# Patient Record
Sex: Female | Born: 1980 | Race: White | Hispanic: No | Marital: Married | Smoking: Current every day smoker
Health system: Southern US, Community
[De-identification: ages and names within clinical notes are randomized; demographics above are authoritative.]

---

## 2014-08-02 ENCOUNTER — Encounter (HOSPITAL_BASED_OUTPATIENT_CLINIC_OR_DEPARTMENT_OTHER): Payer: Self-pay

## 2014-08-02 ENCOUNTER — Emergency Department (HOSPITAL_BASED_OUTPATIENT_CLINIC_OR_DEPARTMENT_OTHER): Payer: BLUE CROSS/BLUE SHIELD

## 2014-08-02 ENCOUNTER — Observation Stay (HOSPITAL_BASED_OUTPATIENT_CLINIC_OR_DEPARTMENT_OTHER)
Admission: EM | Admit: 2014-08-02 | Discharge: 2014-08-03 | Disposition: A | Discharge status: Home / Self Care | Payer: BLUE CROSS/BLUE SHIELD | Attending: Internal Medicine | Admitting: Internal Medicine

## 2014-08-02 DIAGNOSIS — F17200 Nicotine dependence, unspecified, uncomplicated: Secondary | ICD-10-CM

## 2014-08-02 DIAGNOSIS — R079 Chest pain, unspecified: Secondary | ICD-10-CM | POA: Diagnosis present

## 2014-08-02 DIAGNOSIS — E785 Hyperlipidemia, unspecified: Secondary | ICD-10-CM | POA: Diagnosis not present

## 2014-08-02 DIAGNOSIS — Z720 Tobacco use: Secondary | ICD-10-CM | POA: Diagnosis not present

## 2014-08-02 DIAGNOSIS — I209 Angina pectoris, unspecified: Principal | ICD-10-CM | POA: Diagnosis present

## 2014-08-02 LAB — BASIC METABOLIC PANEL
ANION GAP: 3 — AB (ref 5–15)
BUN: 9 mg/dL (ref 6–23)
CO2: 26 mmol/L (ref 19–32)
Calcium: 7.8 mg/dL — ABNORMAL LOW (ref 8.4–10.5)
Chloride: 105 mmol/L (ref 96–112)
Creatinine, Ser: 0.86 mg/dL (ref 0.50–1.10)
GFR calc Af Amer: >90 mL/min (ref >90)
GFR calc non Af Amer: 87 mL/min — ABNORMAL LOW (ref >90)
Glucose, Bld: 93 mg/dL (ref 70–99)
Potassium: 4.4 mmol/L (ref 3.5–5.1)
Sodium: 134 mmol/L — ABNORMAL LOW (ref 135–145)

## 2014-08-02 LAB — CBC WITH DIFFERENTIAL/PLATELET
Basophils Absolute: 0 10*3/uL (ref 0.0–0.1)
Basophils Relative: 0 % (ref 0–1)
Eosinophils Absolute: 0.3 10*3/uL (ref 0.0–0.7)
Eosinophils Relative: 2 % (ref 0–5)
HCT: 39.3 % (ref 36.0–46.0)
Hemoglobin: 12.9 g/dL (ref 12.0–15.0)
Lymphocytes Relative: 22 % (ref 12–46)
Lymphs Abs: 2.5 10*3/uL (ref 0.7–4.0)
MCH: 30.1 pg (ref 26.0–34.0)
MCHC: 32.8 g/dL (ref 30.0–36.0)
MCV: 91.6 fL (ref 78.0–100.0)
MONOS PCT: 5 % (ref 3–12)
Monocytes Absolute: 0.6 10*3/uL (ref 0.1–1.0)
Neutro Abs: 8.2 10*3/uL — ABNORMAL HIGH (ref 1.7–7.7)
Neutrophils Relative %: 71 % (ref 43–77)
PLATELETS: 307 10*3/uL (ref 150–400)
RBC: 4.29 MIL/uL (ref 3.87–5.11)
RDW: 13.3 % (ref 11.5–15.5)
WBC: 11.5 10*3/uL — AB (ref 4.0–10.5)

## 2014-08-02 LAB — CBC
HEMATOCRIT: 35.6 % — AB (ref 36.0–46.0)
Hemoglobin: 12 g/dL (ref 12.0–15.0)
MCH: 30.4 pg (ref 26.0–34.0)
MCHC: 33.7 g/dL (ref 30.0–36.0)
MCV: 90.1 fL (ref 78.0–100.0)
PLATELETS: 305 10*3/uL (ref 150–400)
RBC: 3.95 MIL/uL (ref 3.87–5.11)
RDW: 13.4 % (ref 11.5–15.5)
WBC: 9.8 10*3/uL (ref 4.0–10.5)

## 2014-08-02 LAB — CREATININE, SERUM
Creatinine, Ser: 0.92 mg/dL (ref 0.50–1.10)
GFR, EST NON AFRICAN AMERICAN: 80 mL/min — AB (ref >90)

## 2014-08-02 LAB — PREGNANCY, URINE: Preg Test, Ur: NEGATIVE

## 2014-08-02 LAB — TROPONIN I
Troponin I: <0.03 ng/mL (ref <0.031)
Troponin I: <0.03 ng/mL (ref <0.031)

## 2014-08-02 MED ORDER — ASPIRIN 81 MG PO CHEW: 324.0000 mg | CHEWABLE_TABLET | ORAL | Status: DC

## 2014-08-02 MED ORDER — ASPIRIN 300 MG RE SUPP: 300.0000 mg | RECTAL | Status: DC

## 2014-08-02 MED ORDER — ASPIRIN 81 MG PO CHEW
CHEWABLE_TABLET | ORAL | Status: AC
Start: 2014-08-02 — End: 2014-08-02
  Administered 2014-08-02: 324 mg via ORAL
  Filled 2014-08-02: qty 4

## 2014-08-02 MED ORDER — ASPIRIN EC 81 MG PO TBEC
81.0000 mg | DELAYED_RELEASE_TABLET | Freq: Every day | ORAL | Status: DC
  Administered 2014-08-03: 81 mg via ORAL
  Filled 2014-08-02: qty 1

## 2014-08-02 MED ORDER — ONDANSETRON HCL 4 MG/2ML IJ SOLN
4.0000 mg | Freq: Four times a day (QID) | INTRAMUSCULAR | Status: DC | PRN
Start: 2014-08-02 — End: 2014-08-03

## 2014-08-02 MED ORDER — ACETAMINOPHEN 325 MG PO TABS: 650.0000 mg | ORAL_TABLET | ORAL | Status: DC | PRN

## 2014-08-02 MED ORDER — ACETAMINOPHEN 325 MG PO TABS
650.0000 mg | ORAL_TABLET | ORAL | Status: DC | PRN
  Administered 2014-08-02: 650 mg via ORAL
  Filled 2014-08-02: qty 2

## 2014-08-02 MED ORDER — ASPIRIN 81 MG PO CHEW
324.0000 mg | CHEWABLE_TABLET | Freq: Once | ORAL | Status: AC
  Administered 2014-08-02: 324 mg via ORAL

## 2014-08-02 MED ORDER — ACETAMINOPHEN 325 MG PO TABS
650.0000 mg | ORAL_TABLET | Freq: Once | ORAL | Status: AC
  Administered 2014-08-02: 650 mg via ORAL

## 2014-08-02 MED ORDER — NITROGLYCERIN 0.4 MG SL SUBL
0.4000 mg | SUBLINGUAL_TABLET | SUBLINGUAL | Status: DC | PRN
  Administered 2014-08-02 (×2): 0.4 mg via SUBLINGUAL
  Filled 2014-08-02: qty 1

## 2014-08-02 MED ORDER — ASPIRIN 325 MG PO TABS: 325.0000 mg | ORAL_TABLET | Freq: Once | ORAL | Status: DC

## 2014-08-02 MED ORDER — NITROGLYCERIN 0.4 MG SL SUBL: 0.4000 mg | SUBLINGUAL_TABLET | SUBLINGUAL | Status: DC | PRN

## 2014-08-02 MED ORDER — ACETAMINOPHEN 325 MG PO TABS
ORAL_TABLET | ORAL | Status: AC
  Administered 2014-08-02: 650 mg via ORAL
  Filled 2014-08-02: qty 2

## 2014-08-02 MED ORDER — KETOROLAC TROMETHAMINE 60 MG/2ML IM SOLN
30.0000 mg | Freq: Once | INTRAMUSCULAR | Status: AC
  Administered 2014-08-02: 30 mg via INTRAMUSCULAR
  Filled 2014-08-02: qty 2

## 2014-08-02 MED ORDER — ENOXAPARIN SODIUM 40 MG/0.4ML ~~LOC~~ SOLN
40.0000 mg | SUBCUTANEOUS | Status: DC
  Administered 2014-08-02: 40 mg via SUBCUTANEOUS
  Filled 2014-08-02: qty 0.4

## 2014-08-02 NOTE — H&P (Addendum)
[facility] History and Physical  Amy AmendJessica Lurz ZOX:[id number] DOB: [birth year] DOA: 08/02/2014   PCP: Pcp Not In System    Chief Complaint: chest pain  HPI: Amy Harris is a 34 y.o. female who is morbidly obese presents with chest pain on exertion starting yesterday afternoon. It is present in the center and on the left side of her chest and feels heavy. Associated with dizziness yesterday and shortness of breath today. Improved with rest and improved in the ER with nitro. Currently only complaint is headache. She states she has previously had normal cholesterol levels and is not a diabetic- father has multiple coronary stents- first MI at age 34.    General: The patient denies anorexia, fever, + weight loss 10 with dieting Cardiac: Denies syncope, palpitations, pedal edema  Respiratory: Denies hortness of breath, wheezing- has a chronic dry cough GI: Denies severe indigestion/heartburn, abdominal pain, nausea, vomiting, diarrhea and constipation GU: Denies hematuria, incontinence, dysuria  Musculoskeletal: Denies arthritis  Skin: Denies suspicious skin lesions Neurologic: Denies focal weakness or numbness, change in vision Psychiatry: Denies depression or anxiety. Hematologic: + easy bruising - no easy bleeding   History reviewed. No pertinent past medical history.  History reviewed. No pertinent past surgical history.  Social History: smoker- 1/2ppd for 15 yrs, does drink ETOH Lives at home with husband and children-     No Known Allergies  No family history on file. father has CAD- first MI at age 34- Both parents have DM   Prior to Admission medications   Not on File     Physical Exam: Filed Vitals:   08/02/14 1400 08/02/14 1548 08/02/14 1649 08/02/14 1723  BP: 114/69 123/74 131/76   Pulse: 66 66 60   Temp:   97.9 F (36.6 C)   TempSrc:   Oral   Resp: 13 20 18    Height:    5\' 6"  (1.676 m)  Weight:    127.733 kg (281 lb 9.6 oz)  SpO2: 99% 98% 97%       General: AAO x3 HEENT: Normocephalic and Atraumatic, Mucous membranes pink                PERRLA; EOM intact; No scleral icterus,                 Nares: Patent, Oropharynx: Clear, Fair Dentition                 Neck: FROM, no cervical lymphadenopathy, thyromegaly, carotid bruit or JVD;  Breasts: deferred CHEST WALL: No tenderness  CHEST: Normal respiration, clear to auscultation bilaterally  HEART: Regular rate and rhythm; no murmurs rubs or gallops  BACK: No kyphosis or scoliosis; no CVA tenderness  GI: Positive Bowel Sounds, soft, non-tender; no masses, no organomegaly Rectal Exam: deferred MSK: No cyanosis, clubbing, or edema Genitalia: not examined  SKIN:  no rash or ulceration  CNS: Alert and Oriented x 4, Nonfocal exam, CN 2-12 intact  Labs on Admission:  Basic Metabolic Panel:  Recent Labs Lab 08/02/14 1300  NA 134*  K 4.4  CL 105  CO2 26  GLUCOSE 93  BUN 9  CREATININE 0.86  CALCIUM 7.8*   Liver Function Tests: No results for input(s): AST, ALT, ALKPHOS, BILITOT, PROT, ALBUMIN in the last 168 hours. No results for input(s): LIPASE, AMYLASE in the last 168 hours. No results for input(s): AMMONIA in the last 168 hours. CBC:  Recent Labs Lab 08/02/14 1300  WBC 11.5*  NEUTROABS 8.2*  HGB 12.9  HCT 39.3  MCV 91.6  PLT 307   Cardiac Enzymes:  Recent Labs Lab 08/02/14 1300  TROPONINI <0.03    BNP (last 3 results) No results for input(s): BNP in the last 8760 hours.  ProBNP (last 3 results) No results for input(s): PROBNP in the last 8760 hours.  CBG: No results for input(s): GLUCAP in the last 168 hours.  Radiological Exams on Admission: Dg Chest 2 View  08/02/2014   CLINICAL DATA:  Chest pain  EXAM: CHEST  2 VIEW  COMPARISON:  None.  FINDINGS: The heart size and mediastinal contours are within normal limits. Both lungs are clear. The visualized skeletal structures are unremarkable.  IMPRESSION: No active cardiopulmonary disease.    Electronically Signed   By: Jolaine Click M.D.   On: 08/02/2014 13:17    EKG: Independently reviewed. NSR- no ST- T wave changes  Assessment/Plan Active Problems: Angina pectoris - ekg as above - cardiology recommends a myoview stress test tomorrow- ER doc spoke with Dr Johney Frame already- I will make her NPO after midnight - check Troponin x 3, cont Baby ASA which she started yesterday - check lipid panel and A1c  Morbid obesity Body mass index is 45.47 kg/(m^2). - attempting to lose weight    Consulted: cardiology  Code Status: full code Family Communication:   DVT Prophylaxis:lovenox  Time spent: 45 min  Kesi Perrow, MD [facility]  If 7PM-7AM, please contact night-coverage [email or web address] 08/02/2014, 5:24 PM

## 2014-08-02 NOTE — ED Provider Notes (Signed)
CSN: [id number]     Arrival date & time 08/02/14  1135 History   First MD Initiated Contact with Patient 08/02/14 1207     Chief Complaint  Patient presents with  . Chest Pain     (Consider location/radiation/quality/duration/timing/severity/associated sxs/prior Treatment) HPI   Toni AmendJessica Korber is a 34 y.o. female complaining of chest pain. The pain began yesterday at 3 pm while she was watching television and has been constant since then. She describes the pain as tightness over the anterior chest wall that radiates to both shoulder blades. She states that on exertion the pain becomes worse, becoming a squeezing pain that localizes to the left chest. The pain is worse on inspiration. She has associated dizziness and nausea. She denies vomiting, diaphoresis, leg  swelling, shortness of breath, and heart palpitations. She has taken 2 aspirin tablets since the pain started, with the last dose being between 12:30-1:00 am this morning. She is unsure of the dosage. She has not had any surgeries or had any prolonged travel over the last month. She is on no oral  contraceptives. She states that she has no medical problems. She smokes a  pack of cigarettes a day for the last 15 years. Denies history of DVT/PE and drug use. Her family history is significant for her father, who has had multiple MIs, 9 heart stents, 7 leg stents, CHF, COPD, DM, and bladder cancer. Father had first MI in his 5440s, mother has hypertension and diabetes.  History reviewed. No pertinent past medical history. History reviewed. No pertinent past surgical history. No family history on file. History  Substance Use Topics  . Smoking status: Current Every Day Smoker    Types: Cigarettes  . Smokeless tobacco: Not on file  . Alcohol Use: Not on file   OB History    No data available     Review of Systems   10 systems reviewed and found to be negative, except as noted in the HPI.  Allergies  Review of patient's allergies  indicates no known allergies.  Home Medications   Prior to Admission medications   Not on File   BP 114/69 mmHg  Pulse 66  Temp(Src) 98.2 F (36.8 C) (Oral)  Resp 13  Ht 5\' 6"  (1.676 m)  Wt 280 lb (127.007 kg)  BMI 45.21 kg/m2  SpO2 99%  LMP 07/19/2014 Physical Exam  Constitutional: She is oriented to person, place, and time. She appears well-developed and well-nourished. No distress.  Obese   HENT:  Head: Normocephalic.  Eyes: Conjunctivae and EOM are normal. Pupils are equal, round, and reactive to light.  Neck: Normal range of motion. Neck supple.  Cardiovascular: Normal rate, regular rhythm and intact distal pulses.   Pulmonary/Chest: Effort normal and breath sounds normal. No stridor. No respiratory distress. She has no wheezes. She has no rales. She exhibits no tenderness.  Abdominal: Soft. Bowel sounds are normal. She exhibits no distension and no mass. There is no tenderness. There is no rebound and no guarding.  Musculoskeletal: Normal range of motion. She exhibits no edema or tenderness.  No calf asymmetry, superficial collaterals, palpable cords, edema, Homans sign negative bilaterally.    Neurological: She is alert and oriented to person, place, and time.  Psychiatric: She has a normal mood and affect.  Nursing note and vitals reviewed.   ED Course  Procedures (including critical care time) Labs Review Labs Reviewed  CBC WITH DIFFERENTIAL/PLATELET - Abnormal; Notable for the following:    WBC 11.5 (*)  Neutro Abs 8.2 (*)    All other components within normal limits  BASIC METABOLIC PANEL - Abnormal; Notable for the following:    Sodium 134 (*)    Calcium 7.8 (*)    GFR calc non Af Amer 87 (*)    Anion gap 3 (*)    All other components within normal limits  TROPONIN I  PREGNANCY, URINE    Imaging Review Dg Chest 2 View  08/02/2014   CLINICAL DATA:  Chest pain  EXAM: CHEST  2 VIEW  COMPARISON:  None.  FINDINGS: The heart size and mediastinal  contours are within normal limits. Both lungs are clear. The visualized skeletal structures are unremarkable.  IMPRESSION: No active cardiopulmonary disease.   Electronically Signed   By: Jolaine Click M.D.   On: 08/02/2014 13:17     EKG Interpretation   Date/Time:  Saturday August 02 2014 11:41:30 EST Ventricular Rate:  78 PR Interval:  136 QRS Duration: 84 QT Interval:  344 QTC Calculation: 392 R Axis:   28 Text Interpretation:  Normal sinus rhythm Normal ECG No old tracing to  compare Confirmed by BELFI  MD, MELANIE ([id number]) on 08/02/2014 12:09:31 PM      MDM   Final diagnoses:  Chest pain  Tobacco use disorder    Filed Vitals:   08/02/14 1323 08/02/14 1325 08/02/14 1330 08/02/14 1400  BP: 111/63 111/63 101/70 114/69  Pulse: 71 78 73 66  Temp:      TempSrc:      Resp: Height:      Weight:      SpO2: 95% 96% 98% 99%    Medications  nitroGLYCERIN (NITROSTAT) SL tablet 0.4 mg (0.4 mg Sublingual Given 08/02/14 1307)  aspirin chewable tablet 324 mg (324 mg Oral Given 08/02/14 1252)  acetaminophen (TYLENOL) tablet 650 mg (650 mg Oral Given 08/02/14 1326)  ketorolac (TORADOL) injection 30 mg (30 mg Intramuscular Given 08/02/14 1404)    Dezerae Freiberger is a pleasant 34 y.o. female presenting with nonreproducible chest pain described as tightness exacerbated by exertion onset yesterday. Patient is low risk by Wells criteria and PERC negative. Patient is low risk by heart score, however her score is 3. EKG is nonischemic, troponin is negative, no anemia seen on CBC. Her pain has completely resolved with SL NTG.  Case discussed with attending physician who was concerned about the exertional exacerbation of her pain. Case discussed with cardiologist Dr. Johney Frame who will arrange for Myoview tomorrow morning. Patient will be an [facility]assigned [facility] Mal[facility] Bonds.         Wynetta Emery, PA-C 08/02/14 1456  Rolan Bucco, MD 08/02/14 [phone]

## 2014-08-03 ENCOUNTER — Other Ambulatory Visit: Payer: Self-pay | Admitting: Cardiology

## 2014-08-03 DIAGNOSIS — I209 Angina pectoris, unspecified: Principal | ICD-10-CM

## 2014-08-03 DIAGNOSIS — R0789 Other chest pain: Secondary | ICD-10-CM

## 2014-08-03 DIAGNOSIS — E785 Hyperlipidemia, unspecified: Secondary | ICD-10-CM

## 2014-08-03 DIAGNOSIS — Z720 Tobacco use: Secondary | ICD-10-CM

## 2014-08-03 LAB — LIPID PANEL
CHOL/HDL RATIO: 4.3 ratio
Cholesterol: 113 mg/dL (ref 0–200)
HDL: 26 mg/dL — AB (ref >39)
LDL Cholesterol: 55 mg/dL (ref 0–99)
Triglycerides: 162 mg/dL — ABNORMAL HIGH (ref <150)
VLDL: 32 mg/dL (ref 0–40)

## 2014-08-03 LAB — TROPONIN I
Troponin I: <0.03 ng/mL (ref <0.031)
Troponin I: <0.03 ng/mL (ref <0.031)

## 2014-08-03 MED ORDER — NICOTINE 7 MG/24HR TD PT24
7.0000 mg | MEDICATED_PATCH | Freq: Every day | TRANSDERMAL | Status: DC
  Administered 2014-08-03: 7 mg via TRANSDERMAL
  Filled 2014-08-03: qty 1

## 2014-08-03 MED ORDER — NITROGLYCERIN 0.4 MG SL SUBL: 0.4000 mg | SUBLINGUAL_TABLET | SUBLINGUAL | Status: AC | PRN

## 2014-08-03 MED ORDER — ASPIRIN 81 MG PO TBEC: 81.0000 mg | DELAYED_RELEASE_TABLET | Freq: Every day | ORAL | Status: AC

## 2014-08-03 MED ORDER — NICOTINE 7 MG/24HR TD PT24: 7.0000 mg | MEDICATED_PATCH | Freq: Every day | TRANSDERMAL | Status: AC

## 2014-08-03 NOTE — Progress Notes (Signed)
Utilization Review Completed.   Calder Oblinger, RN, BSN Nurse Case Manager  

## 2014-08-03 NOTE — Discharge Summary (Signed)
Physician Discharge Summary  Amy Harris ZOX:[id number] DOB: 06/25/1980 DOA: 08/02/2014  PCP: [facility]  Admit date: 08/02/2014 Discharge date: 08/03/2014  Recommendations for Outpatient Follow-up:  1. Outpatient treadmill stress test will be arranged by cardiology, patient will be called with appointment time and date  Discharge Diagnoses:  Principal Problem:   Angina pectoris Active Problems:   Chest pain   Dyslipidemia   Morbid obesity   Tobacco abuse   Discharge Condition: stable, improved  Diet recommendation: healthy heart  Wt Readings from Last 3 Encounters:  08/03/14 128.822 kg (284 lb)    History of present illness:  Amy Harris is a 34 y.o. female with strong family history of early MI (60s), tobacco abuse, and morbid obesity presented with chest pain, SOB, and dizziness.  Her chest tightness was relieved by NTG.   Hospital Course:   Atypical chest pain:  Heart score of 2.  She may have had some musculoskeletal pain related to recent URI for which she has been taking prednisone and antibiotics.   -  ECG NSR -  Tele:  NSR -  troponins negative -  Cardiology recommends outpatient stress test:  Walking treadmill -  NTG prn -  Daily aspirin until follow up  Dyslipidemia: Lab Results  Component Value Date   CHOL 113 08/03/2014   HDL 26* 08/03/2014   LDLCALC 55 08/03/2014   TRIG 162* 08/03/2014   CHOLHDL 4.3 08/03/2014  -  LDL low, but HDL also low.  Will defer conversation regarding possible treatment of low HDL to cardiology as outpatient  No results found for: HGBA1C  Tobacco abuse -  Encouraged cessation -  Nicotine patch Rx  Morbid obesity -  Recommend weight loss  Procedures:  None  Consultations:  Cardiology  Discharge Exam: Filed Vitals:   08/03/14 0500  BP: 110/73  Pulse: 71  Temp: 98 F (36.7 C)  Resp: 11   Filed Vitals:   08/02/14 1649 08/02/14 1723 08/02/14 2100 08/03/14 0500  BP: 131/76   127/77 110/73  Pulse: 60  66 71  Temp: 97.9 F (36.6 C)  98 F (36.7 C) 98 F (36.7 C)  TempSrc: Oral     Resp: Height:   (1.676 m)    Weight:  127.733 kg (281 lb 9.6 oz)  128.822 kg (284 lb)  SpO2: 97%  96% 98%    General: Obese F, NAD Cardiovascular: RRR, no mrg, 2+ pulses Respiratory: CTAB, no increased WOB ABD: NABS, soft, ND/NT MSK:  No LEE  Discharge Instructions      Discharge Instructions    Call MD for:  difficulty breathing, headache or visual disturbances    Complete by:  As directed      Call MD for:  extreme fatigue    Complete by:  As directed      Call MD for:  hives    Complete by:  As directed      Call MD for:  persistant dizziness or light-headedness    Complete by:  As directed      Call MD for:  persistant nausea and vomiting    Complete by:  As directed      Call MD for:  severe uncontrolled pain    Complete by:  As directed      Call MD for:  temperature >100.4    Complete by:  As directed      Diet - low sodium heart healthy  Complete by:  As directed      Discharge instructions    Complete by:  As directed   You were hospitalized with chest pain.  Please take a baby aspirin every day and you may use nitroglycerin as needed if you experience severe chest pain with shortness of breath.  Please call 911 if you have worsening chest pains.  You will be called by the cardiology office in the next two days to schedule a stress test.  If you have not heard from them, please call their office to schedule the appointment.  Please STOP smoking.     Increase activity slowly    Complete by:  As directed             Medication List    TAKE these medications        albuterol 1.25 MG/3ML nebulizer solution  Commonly known as:  ACCUNEB  Take 1 ampule by nebulization every 4 (four) hours as needed for wheezing or shortness of breath.     VENTOLIN HFA 108 (90 BASE) MCG/ACT inhaler  Generic drug:  albuterol  Inhale 1-2 puffs into the  lungs every 4 (four) hours as needed for wheezing or shortness of breath.     aspirin 81 MG EC tablet  Take 1 tablet (81 mg total) by mouth daily.     nicotine 7 mg/24hr patch  Commonly known as:  NICODERM CQ - dosed in mg/24 hr  Place 1 patch (7 mg total) onto the skin daily.     nitroGLYCERIN 0.4 MG SL tablet  Commonly known as:  NITROSTAT  Place 1 tablet (0.4 mg total) under the tongue every 5 (five) minutes x 3 doses as needed for chest pain.       Follow-up Information    Follow up with [facility]. Schedule an appointment as soon as possible for a visit in 2 weeks.   Specialty:  Cardiology   Contact information:   [address], [address] [place] [address] [phone]       The results of significant diagnostics from this hospitalization (including imaging, microbiology, ancillary and laboratory) are listed below for reference.    Significant Diagnostic Studies: Dg Chest 2 View  08/02/2014   CLINICAL DATA:  Chest pain  EXAM: CHEST  2 VIEW  COMPARISON:  None.  FINDINGS: The heart size and mediastinal contours are within normal limits. Both lungs are clear. The visualized skeletal structures are unremarkable.  IMPRESSION: No active cardiopulmonary disease.   Electronically Signed   By: Jolaine Click M.D.   On: 08/02/2014 13:17    Microbiology: No results found for this or any previous visit (from the past 240 hour(s)).   Labs: Basic Metabolic Panel:  Recent Labs Lab 08/02/14 1300 08/02/14 1820  NA 134*  --   K 4.4  --   CL 105  --   CO2 26  --   GLUCOSE 93  --   BUN 9  --   CREATININE 0.86 0.92  CALCIUM 7.8*  --    Liver Function Tests: No results for input(s): AST, ALT, ALKPHOS, BILITOT, PROT, ALBUMIN in the last 168 hours. No results for input(s): LIPASE, AMYLASE in the last 168 hours. No results for input(s): AMMONIA in the last 168 hours. CBC:  Recent Labs Lab 08/02/14 1300 08/02/14 1820  WBC 11.5* 9.8   NEUTROABS 8.2*  --   HGB 12.9 12.0  HCT 39.3 35.6*  MCV 91.6 90.1  PLT 307 305   Cardiac Enzymes:  Recent Labs Lab 08/02/14 1300 08/02/14 1820 08/02/14 2325 08/03/14 0548  TROPONINI <0.03 <0.03 <0.03 <0.03   BNP: BNP (last 3 results) No results for input(s): BNP in the last 8760 hours.  ProBNP (last 3 results) No results for input(s): PROBNP in the last 8760 hours.  CBG: No results for input(s): GLUCAP in the last 168 hours.  Time coordinating discharge: 35 minutes  Signed:  Solangel Mcmanaway  [facility] 08/03/2014, 10:35 AM

## 2014-08-03 NOTE — Consult Note (Signed)
Admit date: 08/02/2014 Referring Physician  Dr. Malachi BondsShort Primary Physician Pcp Not In System Primary Cardiologist  None Reason for Consultation  Chest pain  HPI: 34 year old female with morbid obesity, smoker half pack per day, father with MI at age 34 who was transferred from [facility] after experiencing chest pain on exertion starting yesterday afternoon. She felt a heaviness, dizziness and perhaps some shortness of breath. Nitroglycerin was administered in the emergency room and she felt better. Normal cholesterols in the past, nondiabetic however strong family history of CAD with her father having his first MI at age 34.  Overnight, troponins were all normal. EKG shows no ST segment changes. Normal.  Currently feeling well, no chest pain. Had bowel movement this morning. Ambulating well without any difficulty.    PMH:  History reviewed. No pertinent past medical history. Obesity  PSH:  History reviewed. No pertinent past surgical history. Allergies:  Review of patient's allergies indicates no known allergies. Prior to Admit Meds:   Prior to Admission medications   Medication Sig Start Date End Date Taking? Authorizing Provider  albuterol (ACCUNEB) 1.25 MG/3ML nebulizer solution Take 1 ampule by nebulization every 4 (four) hours as needed for wheezing or shortness of breath.  07/07/14  Yes Historical Provider, MD  VENTOLIN HFA 108 (90 BASE) MCG/ACT inhaler Inhale 1-2 puffs into the lungs every 4 (four) hours as needed for wheezing or shortness of breath.  07/07/14  Yes Historical Provider, MD   Fam HX:   Father with MI 6740  Social HX:    History   Social History  . Marital Status: Married    Spouse Name: N/A  . Number of Children: N/A  . Years of Education: N/A   Occupational History  . Not on file.   Social History Main Topics  . Smoking status: Current Every Day Smoker -- 0.50 packs/day    Types: Cigarettes  . Smokeless tobacco: Not on file  . Alcohol Use: No  .  Drug Use: No  . Sexual Activity: Yes   Other Topics Concern  . Not on file   Social History Narrative  . No narrative on file     ROS:   no syncope, no bleeding, no orthopnea, no PND, no rashes. After childbirth, she did have a brief period of hypertension which resolved. All 11 ROS were addressed and are negative except what is stated in the HPI   Physical Exam: Blood pressure 110/73, pulse 71, temperature 98 F (36.7 C), temperature source Oral, resp. rate 11, height 5\' 6"  (1.676 m), weight 284 lb (128.822 kg), last menstrual period 07/23/2014, SpO2 98 %.   General: Well developed, well nourished, in no acute distress Head: Eyes PERRLA, No xanthomas.   Normal cephalic and atramatic  Lungs:   Clear bilaterally to auscultation and percussion. Normal respiratory effort. No wheezes, no rales. Heart:   HRRR S1 S2 Pulses are 2+ & equal. No murmur, rubs, gallops.  No carotid bruit. No JVD.  No abdominal bruits.  Abdomen: Bowel sounds are positive, abdomen soft and non-tender without masses. No hepatosplenomegaly.Obese  Msk:  Back normal. Normal strength and tone for age. Extremities:  No clubbing, cyanosis or edema.  DP +1 Neuro: Alert and oriented X 3, non-focal, MAE x 4 GU: Deferred Rectal: Deferred Psych:  Good affect, responds appropriately      Labs: Lab Results  Component Value Date   WBC 9.8 08/02/2014   HGB 12.0 08/02/2014   HCT 35.6*  08/02/2014   MCV 90.1 08/02/2014   PLT 305 08/02/2014     Recent Labs Lab 08/02/14 1300 08/02/14 1820  NA 134*  --   K 4.4  --   CL 105  --   CO2 26  --   BUN 9  --   CREATININE 0.86 0.92  CALCIUM 7.8*  --   GLUCOSE 93  --     Recent Labs  08/02/14 1300 08/02/14 1820 08/02/14 2325 08/03/14 0548  TROPONINI <0.03 <0.03 <0.03 <0.03   Lab Results  Component Value Date   CHOL 113 08/03/2014   HDL 26* 08/03/2014   LDLCALC 55 08/03/2014   TRIG 162* 08/03/2014      Radiology:  Dg Chest 2 View  08/02/2014   CLINICAL  DATA:  Chest pain  EXAM: CHEST  2 VIEW  COMPARISON:  None.  FINDINGS: The heart size and mediastinal contours are within normal limits. Both lungs are clear. The visualized skeletal structures are unremarkable.  IMPRESSION: No active cardiopulmonary disease.   Electronically Signed   By: Jolaine Click M.D.   On: 08/02/2014 13:17   Personally viewed.  EKG:  Normal sinus rhythm, no ST segment changes  Personally viewed.   ASSESSMENT/PLAN:    34 year old female smoker with father who had myocardial infarction at age 51, nondiabetic, nonhypertensive, normal cholesterol with morbid obesity here for evaluation of chest pain/dyspnea.  1. Chest pain-troponin and EKG are reassuring, unremarkable. With her strong family history, smoking history as well as risk factor of obesity, it would not be unreasonable to perform stress test evaluation. Given negative nature of troponin and chest pain-free at this time, we will allow her to be discharged and we will set her up for an outpatient exercise treadmill test. Expressed the importance of primary prevention, tobacco cessation, weight loss. She is amenable to plan. Currently chest pain-free, feeling well.  2. Morbid obesity-she and her family have started a heart healthy diet. Inspired by her by her father who actually needs to go forward with bypass surgery and he is trying to lose some weight.  3. Tobacco use-tobacco cessation counseling  Donato Schultz, MD  08/03/2014  9:38 AM

## 2014-08-04 LAB — HEMOGLOBIN A1C
Hgb A1c MFr Bld: 5.5 % (ref 4.8–5.6)
Mean Plasma Glucose: 111 mg/dL

## 2014-09-02 ENCOUNTER — Telehealth (HOSPITAL_COMMUNITY): Payer: Self-pay

## 2014-09-02 NOTE — Telephone Encounter (Signed)
Encounter complete. 

## 2014-09-03 ENCOUNTER — Telehealth (HOSPITAL_COMMUNITY): Payer: Self-pay

## 2014-09-03 NOTE — Telephone Encounter (Signed)
Encounter complete. 

## 2014-09-04 ENCOUNTER — Encounter (HOSPITAL_COMMUNITY): Payer: BLUE CROSS/BLUE SHIELD

## 2016-06-19 IMAGING — DX DG CHEST 2V
2 series · 2 of 2 positions shown · non-contrast
Comparison: None.

CLINICAL DATA: Chest pain

EXAM:
CHEST  2 VIEW

[chest pa]
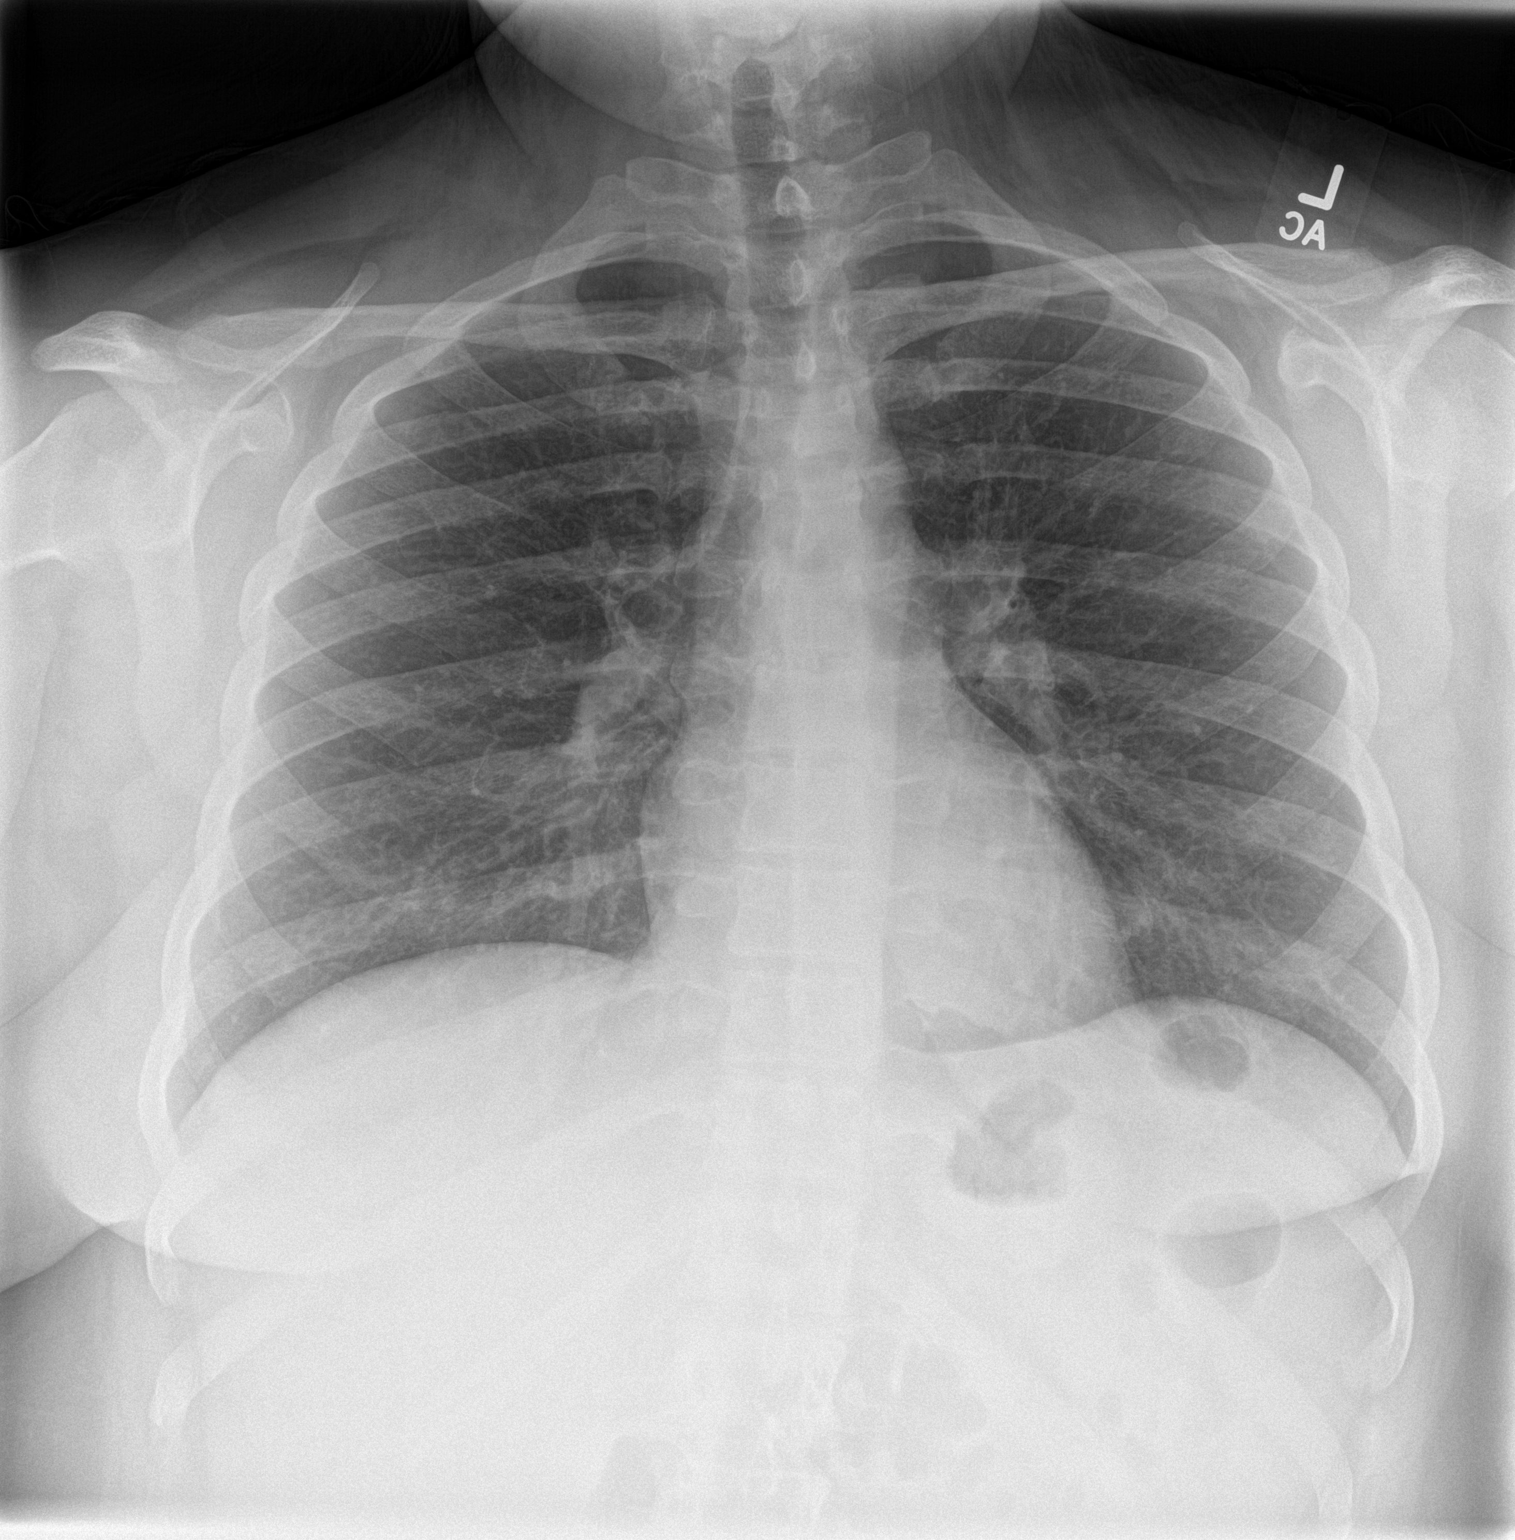

[chest lat]
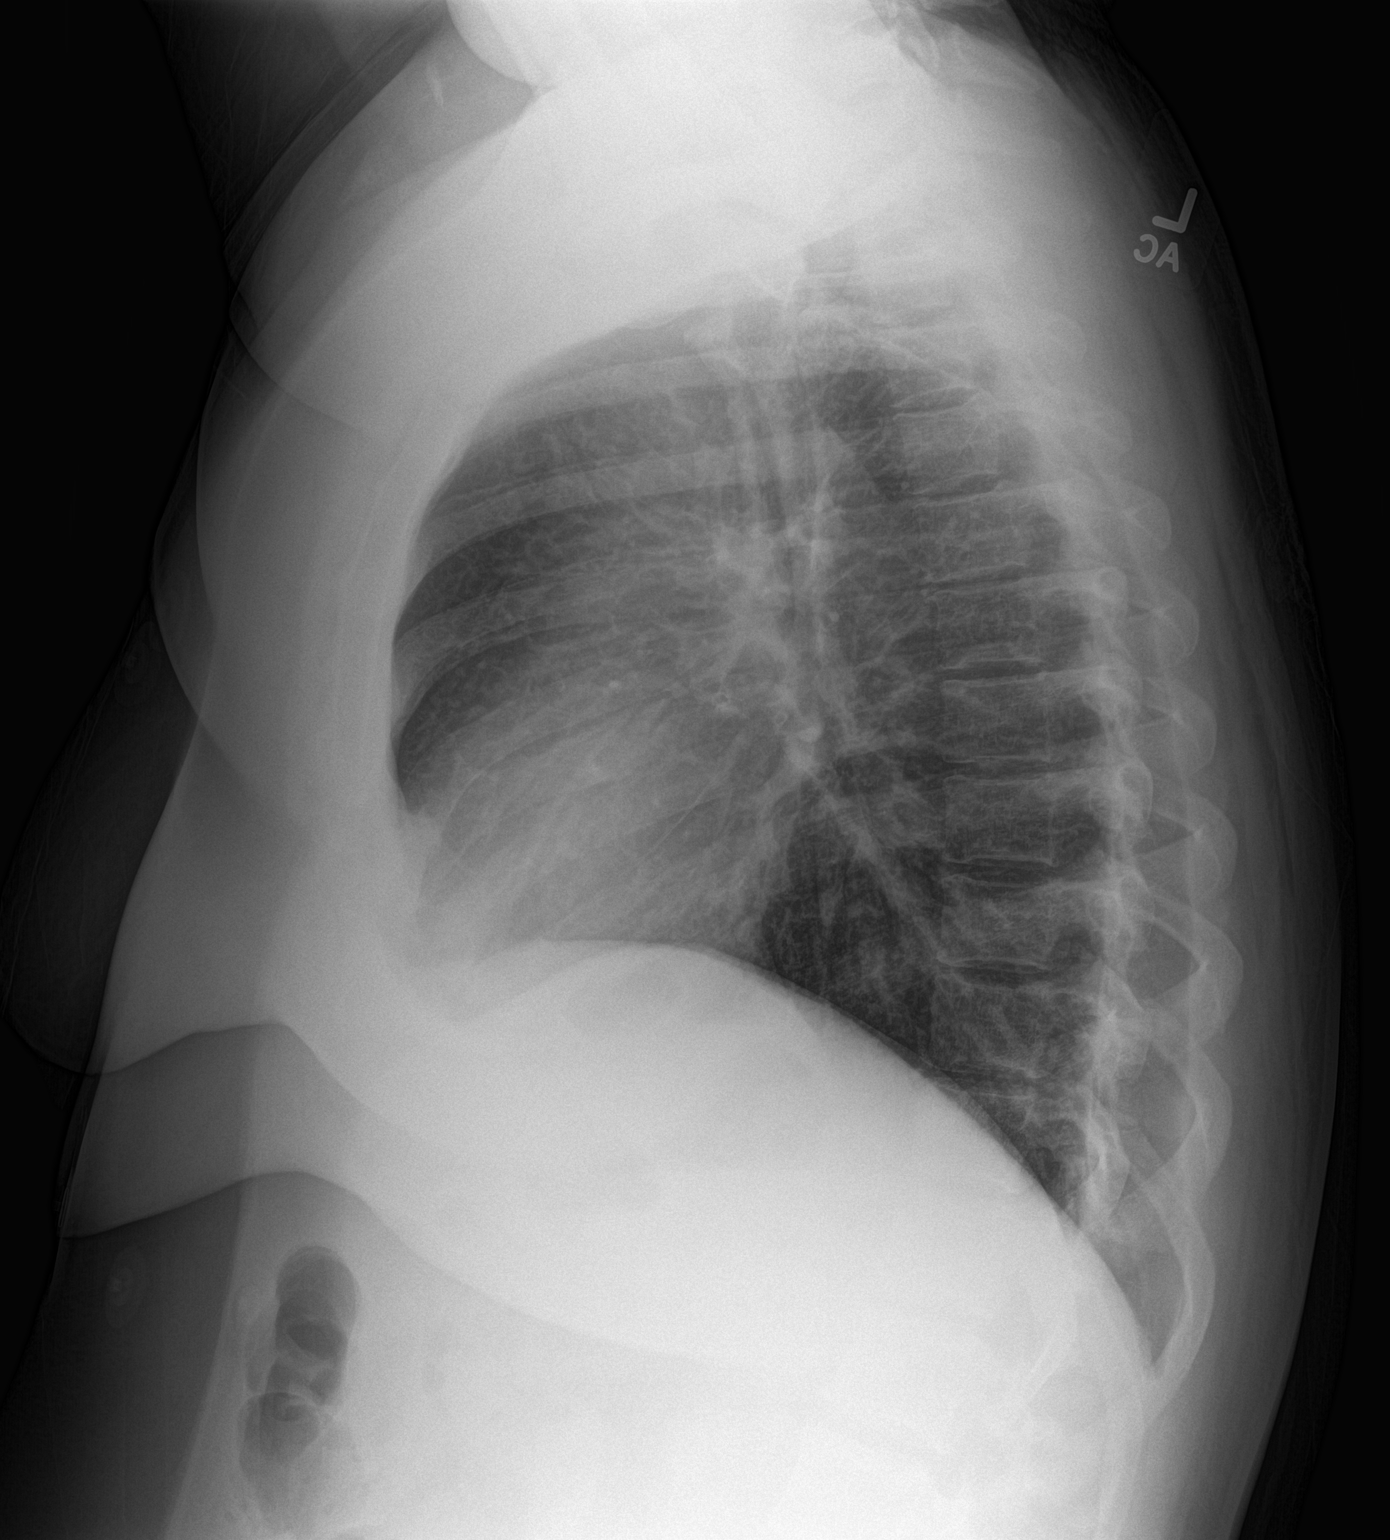

[2 of 2 positions shown; findings below may reference images not displayed]

FINDINGS: The heart size and mediastinal contours are within normal limits.
Both lungs are clear. The visualized skeletal structures are
unremarkable.
IMPRESSION: No active cardiopulmonary disease.
